# Patient Record
Sex: Female | Born: 1970 | Race: White | Hispanic: No | Marital: Married | Smoking: Never smoker
Health system: Southern US, Community
[De-identification: ages and names within clinical notes are randomized; demographics above are authoritative.]

## PROBLEM LIST (undated history)

## (undated) DIAGNOSIS — Z9289 Personal history of other medical treatment: Secondary | ICD-10-CM

## (undated) DIAGNOSIS — L309 Dermatitis, unspecified: Secondary | ICD-10-CM

## (undated) DIAGNOSIS — I10 Essential (primary) hypertension: Secondary | ICD-10-CM

## (undated) HISTORY — PX: NO PAST SURGERIES: SHX2092

## (undated) HISTORY — DX: Dermatitis, unspecified: L30.9

## (undated) HISTORY — DX: Personal history of other medical treatment: Z92.89

---

## 2004-11-28 ENCOUNTER — Other Ambulatory Visit: Admission: RE | Admit: 2004-11-28 | Discharge: 2004-11-28 | Payer: Self-pay | Admitting: Obstetrics and Gynecology

## 2006-01-29 ENCOUNTER — Other Ambulatory Visit: Admission: RE | Admit: 2006-01-29 | Discharge: 2006-01-29 | Payer: Self-pay | Admitting: Obstetrics and Gynecology

## 2008-09-13 ENCOUNTER — Encounter: Admission: RE | Admit: 2008-09-13 | Discharge: 2008-09-13 | Payer: Self-pay | Admitting: Family Medicine

## 2014-08-31 ENCOUNTER — Other Ambulatory Visit: Payer: Self-pay

## 2014-08-31 DIAGNOSIS — Z1231 Encounter for screening mammogram for malignant neoplasm of breast: Secondary | ICD-10-CM

## 2014-09-14 ENCOUNTER — Encounter (INDEPENDENT_AMBULATORY_CARE_PROVIDER_SITE_OTHER): Payer: Self-pay

## 2014-09-14 ENCOUNTER — Ambulatory Visit
Admission: RE | Admit: 2014-09-14 | Discharge: 2014-09-14 | Disposition: A | Discharge status: Home / Self Care | Payer: BC Managed Care – PPO | Source: Ambulatory Visit

## 2014-09-14 DIAGNOSIS — Z1231 Encounter for screening mammogram for malignant neoplasm of breast: Secondary | ICD-10-CM

## 2014-11-06 ENCOUNTER — Encounter: Payer: BC Managed Care – PPO | Admitting: Physician Assistant

## 2014-12-07 ENCOUNTER — Ambulatory Visit (INDEPENDENT_AMBULATORY_CARE_PROVIDER_SITE_OTHER): Payer: BC Managed Care – PPO | Admitting: Physician Assistant

## 2014-12-07 ENCOUNTER — Ambulatory Visit (HOSPITAL_COMMUNITY): Payer: BC Managed Care – PPO | Attending: Physician Assistant | Admitting: Radiology

## 2014-12-07 DIAGNOSIS — I10 Essential (primary) hypertension: Secondary | ICD-10-CM | POA: Insufficient documentation

## 2014-12-07 DIAGNOSIS — R079 Chest pain, unspecified: Secondary | ICD-10-CM | POA: Diagnosis not present

## 2014-12-07 DIAGNOSIS — R0789 Other chest pain: Secondary | ICD-10-CM

## 2014-12-07 DIAGNOSIS — R9439 Abnormal result of other cardiovascular function study: Secondary | ICD-10-CM

## 2014-12-07 MED ORDER — TECHNETIUM TC 99M SESTAMIBI GENERIC - CARDIOLITE
33.0000 | Freq: Once | INTRAVENOUS | Status: AC | PRN
  Administered 2014-12-07: 33 via INTRAVENOUS

## 2014-12-07 NOTE — Progress Notes (Signed)
Exercise Treadmill Test  Claire Hood is a 43 y.o. female with a  Hx of HTN, HL, FHx CAD referred by PCP for ETT due to atypical chest pain. Non-smoker.  No diabetes.  Exam unremarkable.  ECG:  NSR, HR 86, no ST changes.  Pre-Exercise Testing Evaluation Rhythm: normal sinus  Rate: 86 bpm     Test  Exercise Tolerance Test Ordering MD: Armanda Magicraci Turner, MD  Interpreting MD: Tereso NewcomerScott Renji Berwick, PA-C  Unique Test No: 1  Treadmill:  1  Indication for ETT: chest pain - rule out ischemia/HTN  Contraindication to ETT: No   Stress Modality: exercise - treadmill  Cardiac Imaging Performed: non   Protocol: standard Bruce - maximal  Max BP:  238/67  Max MPHR (bpm):  175 85% MPR (bpm):  149  MPHR obtained (bpm):  155 % MPHR obtained:  88  Reached 85% MPHR (min:sec):  4:00 Total Exercise Time (min-sec):  4:30  Workload in METS:  6.4 Borg Scale: 12  Reason ETT Terminated:  exaggerated hypertensive response    ST Segment Analysis At Rest: normal ST segments - no evidence of significant ST depression With Exercise: significant ischemic ST depression  Other Information Arrhythmia:  No Angina during ETT:  absent (0) Quality of ETT:  diagnostic  ETT Interpretation:  abnormal - evidence of ST depression consistent with ischemia  Comments: Fair exercise capacity. No chest pain. Exaggerated hypertensive BP response to exercise. There was significant inferolateral ST depression at peak exercise.   Recommendations: Arrange Lexiscan Myoview. Reviewed with Dr. Armanda Magicraci Turner (DOD) who agreed. Signed,  Tereso NewcomerScott Burke Terry, PA-C   12/07/2014 10:45 AM

## 2014-12-14 ENCOUNTER — Ambulatory Visit (HOSPITAL_COMMUNITY): Payer: BC Managed Care – PPO | Attending: Cardiovascular Disease

## 2014-12-14 DIAGNOSIS — I10 Essential (primary) hypertension: Secondary | ICD-10-CM | POA: Diagnosis not present

## 2014-12-14 DIAGNOSIS — Z79899 Other long term (current) drug therapy: Secondary | ICD-10-CM | POA: Diagnosis not present

## 2014-12-14 DIAGNOSIS — Z136 Encounter for screening for cardiovascular disorders: Secondary | ICD-10-CM | POA: Insufficient documentation

## 2014-12-14 DIAGNOSIS — R0600 Dyspnea, unspecified: Secondary | ICD-10-CM | POA: Insufficient documentation

## 2014-12-14 DIAGNOSIS — R9439 Abnormal result of other cardiovascular function study: Secondary | ICD-10-CM | POA: Diagnosis present

## 2014-12-14 DIAGNOSIS — R0989 Other specified symptoms and signs involving the circulatory and respiratory systems: Secondary | ICD-10-CM

## 2014-12-14 MED ORDER — REGADENOSON 0.4 MG/5ML IV SOLN
0.4000 mg | Freq: Once | INTRAVENOUS | Status: AC
  Administered 2014-12-14: 0.4 mg via INTRAVENOUS

## 2014-12-14 MED ORDER — TECHNETIUM TC 99M SESTAMIBI GENERIC - CARDIOLITE
33.0000 | Freq: Once | INTRAVENOUS | Status: AC | PRN
  Administered 2014-12-14: 33 via INTRAVENOUS

## 2014-12-14 NOTE — Progress Notes (Signed)
[facility] SITE 3 NUCLEAR MED [address] [place], [place] [address] [phone]    Cardiology Nuclear Med Study  Claire PearsonCarla J Claire BlazingSmith is a 43 y.o. female     MRN : [id number]     DOB: 11/13/1971  Procedure Date: 12/14/2014  Nuclear Med Background Indication for Stress Test:  Evaluation for Ischemia and Abnormal EKG History:  No Known CAD History Cardiac Risk Factors: Hypertension  Symptoms:  Chest Pain and DOE   Nuclear Pre-Procedure Caffeine/Decaff Intake:  None NPO After: 8:00pm   Lungs:  clear O2 Sat: 98% on room air. IV 0.9% NS with Angio Cath:  22g  IV Site: L Hand  IV Started by:  Frederick Peerseresa Johnson, EMT-P  Chest Size (in):  42 Cup Size: C  Height: 66inches Weight: 258 lb  BMI:  Body mass index is 35.44 kg/(m^2). Tech Comments:  No meds with held this am    Nuclear Med Study 1 or 2 day study: 2 day  Stress Test Type:  Eugenie BirksLexiscan  Reading MD: n/a  Order Authorizing Provider:  Armanda Magicraci Turner, MD  Resting Radionuclide: Technetium 4748m Sestamibi  Resting Radionuclide Dose: 33.0 mCi on 12/07/2014  Stress Radionuclide:  Technetium 4648m Sestamibi  Stress Radionuclide Dose: 33.0 mCi on 12/14/2014          Stress Protocol Rest HR: 71 Stress HR: 111  Rest BP: 128/66 Stress BP: 127/58  Exercise Time (min): n/a METS: n/a   Predicted Max HR: 175 bpm % Max HR: 40.57 bpm Rate Pressure Product: 9017   Dose of Adenosine (mg):  n/a Dose of Lexiscan: 0.4 mg  Dose of Atropine (mg): n/a Dose of Dobutamine: n/a mcg/kg/min (at max HR)  Stress Test Technologist: Frederick Peerseresa Johnson, EMT-P  Nuclear Technologist:  Kerby NoraElzbieta Kubak, CNMT     Rest Procedure:  Myocardial perfusion imaging was performed at rest 45 minutes following the intravenous administration of Technetium 3748m Sestamibi. Rest ECG: NSR - Normal EKG  Stress Procedure:  The patient received IV Lexiscan 0.4 mg over 15-seconds.  Technetium 1748m Sestamibi injected at 30-seconds.  Quantitative spect images were obtained after a 45  minute delay. Stress ECG: No significant change from baseline ECG  QPS Raw Data Images:  There is a breast shadow that accounts for the anterior attenuation. Stress Images:  Normal homogeneous uptake in all areas of the myocardium. Rest Images:  Normal homogeneous uptake in all areas of the myocardium. Subtraction (SDS):  There is a fixed anterior defect that is most consistent with breast attenuation. Transient Ischemic Dilatation (Normal <1.22):  1.30 Lung/Heart Ratio (Normal <0.45):  0.23  Quantitative Gated Spect Images QGS EDV:  103 ml QGS ESV:  39 ml  Impression Exercise Capacity:  Lexiscan with no exercise. BP Response:  Normal blood pressure response. Clinical Symptoms:  No chest pain. ECG Impression:  No significant ECG changes with Lexiscan. Comparison with Prior Nuclear Study: No previous nuclear study performed  Overall Impression:  Low risk stress nuclear study. There is anterior wall attenuation on rest and stress images (more pronounced on stress images) which is most suggestive of breast attenuation. SDS 4.  The left ventricular function is normal and the anterior wall contracts normally. No ischemia. Apparent increased TID appears to be related to body habitus and slightly tangential cuts through the LV cavity more pronounced on the stress images.  LV Ejection Fraction: 62%.  LV Wall Motion:  NL LV Function; NL Wall Motion  Cassell Clementhomas Hatsuko Bizzarro MD

## 2014-12-17 ENCOUNTER — Encounter: Payer: Self-pay | Admitting: Physician Assistant

## 2015-12-27 IMAGING — MG MM SCREENING BREAST TOMO BILATERAL
8 series · 8 of 24 positions shown · non-contrast
Comparison: None.

CLINICAL DATA: Screening.

EXAM:
DIGITAL SCREENING BILATERAL MAMMOGRAM WITH 3D TOMO WITH CAD

[R MLO]
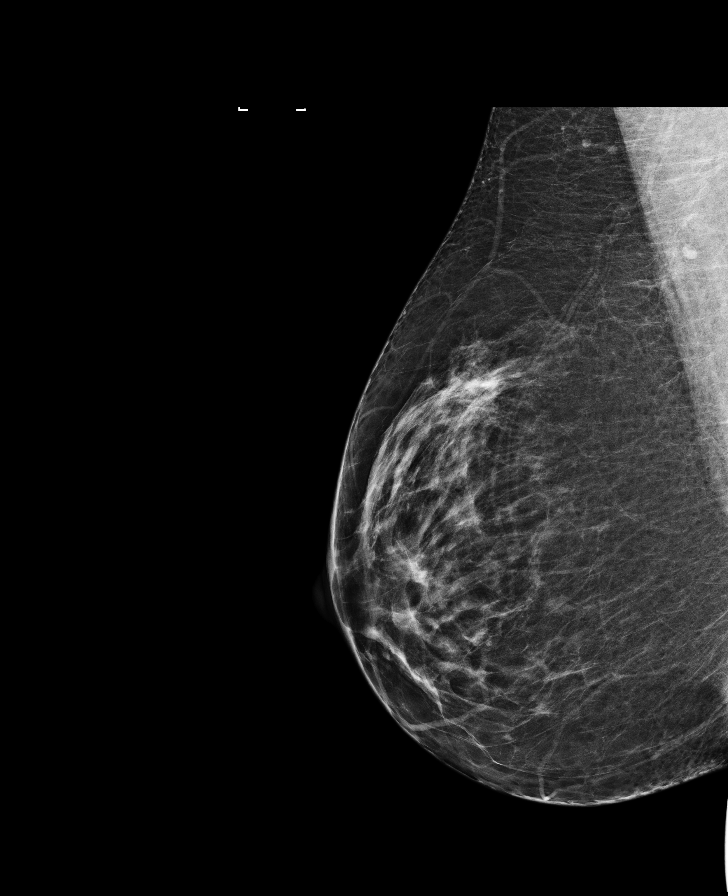

[R CC]
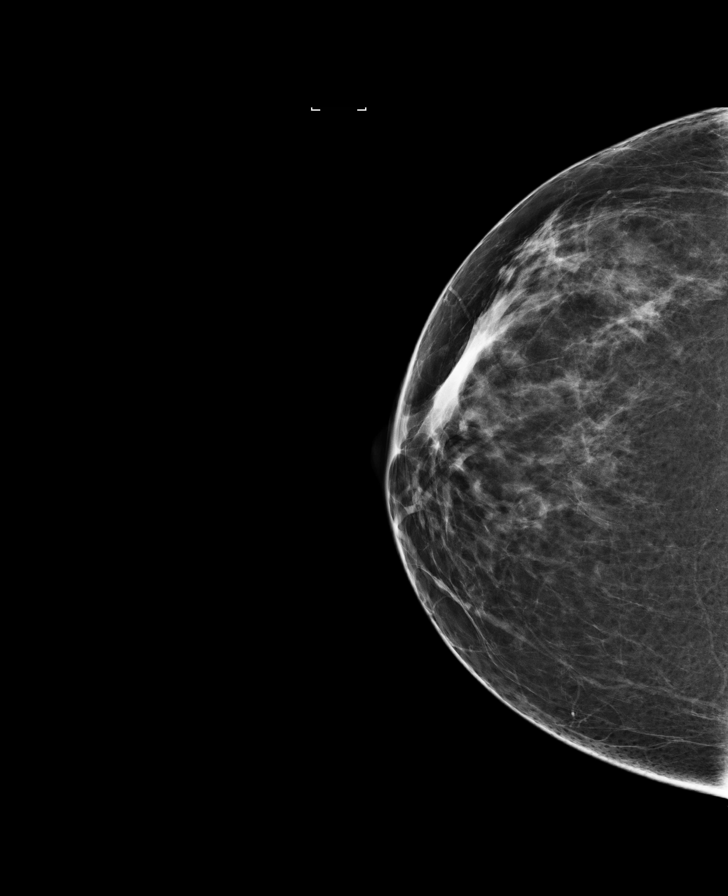

[L MLO]
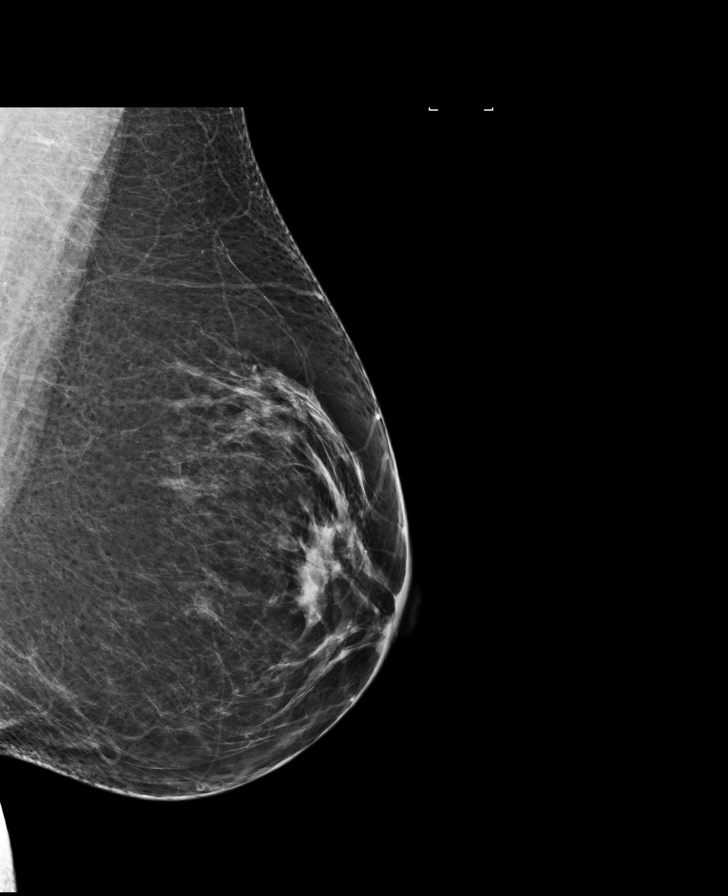

[L CC]
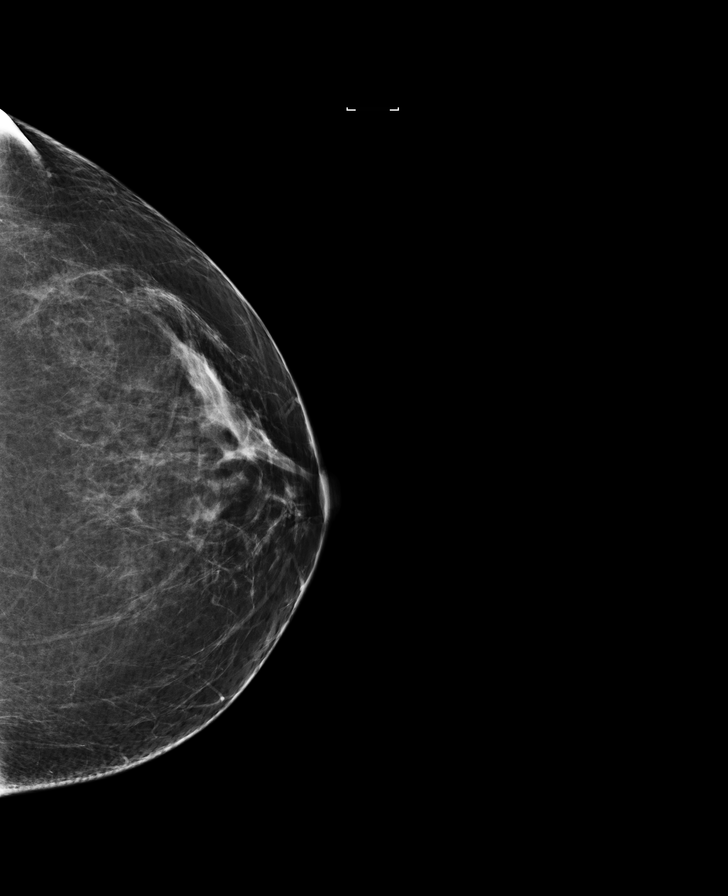

[L MLO tomo · tomo slice 43/85.0]
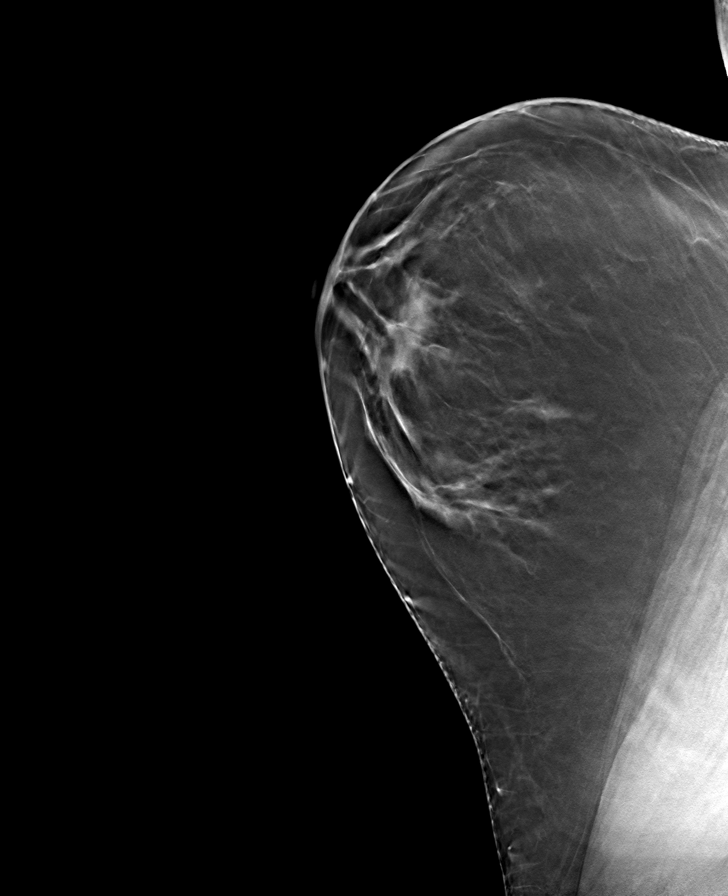

[R CC tomo · tomo slice 37/74.0]
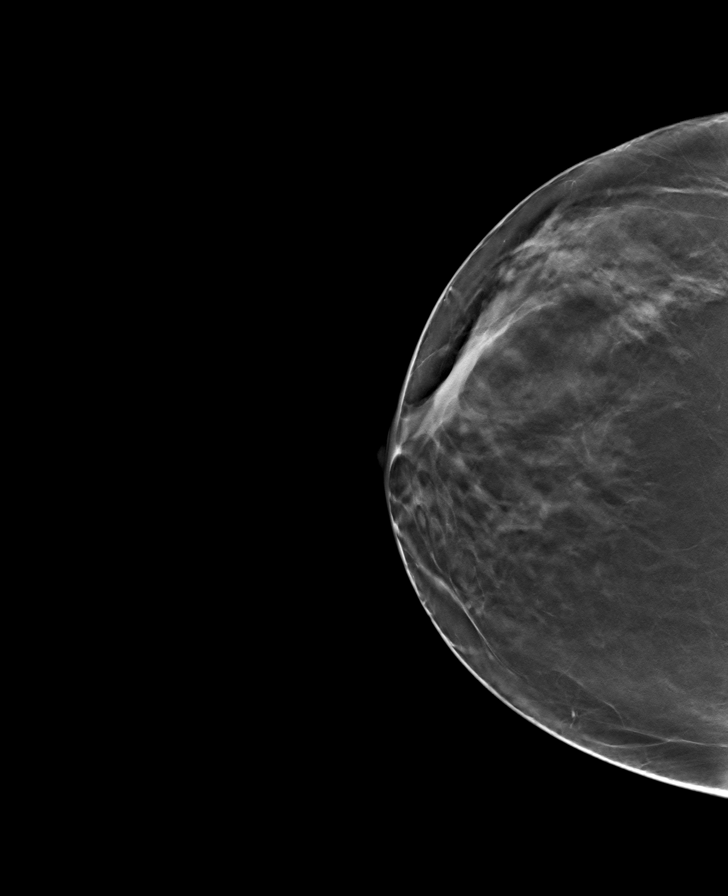

[L CC tomo · tomo slice 44/87.0]
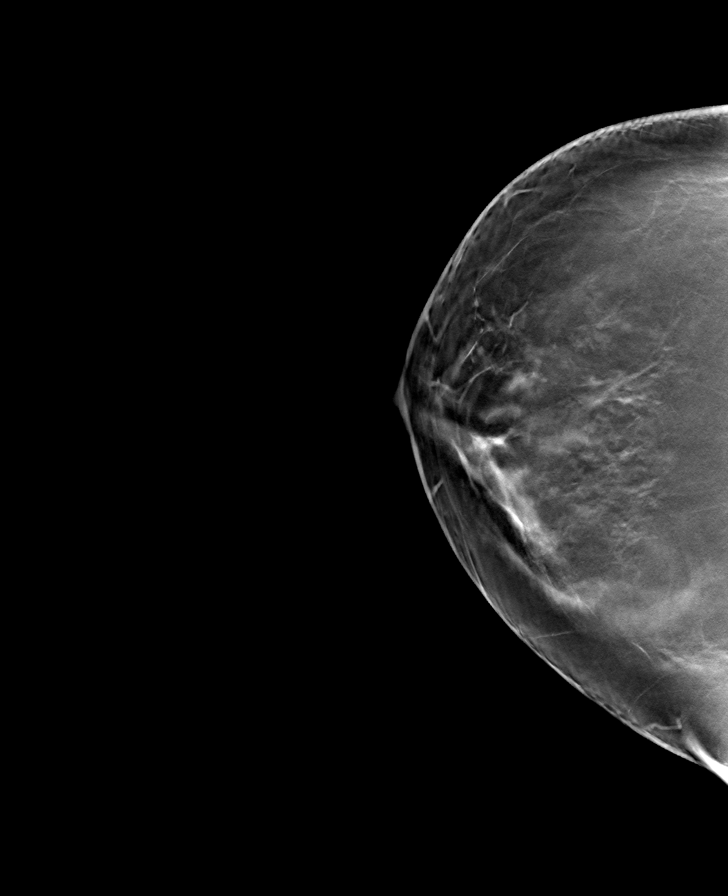

[R MLO tomo · tomo slice 42/83.0]
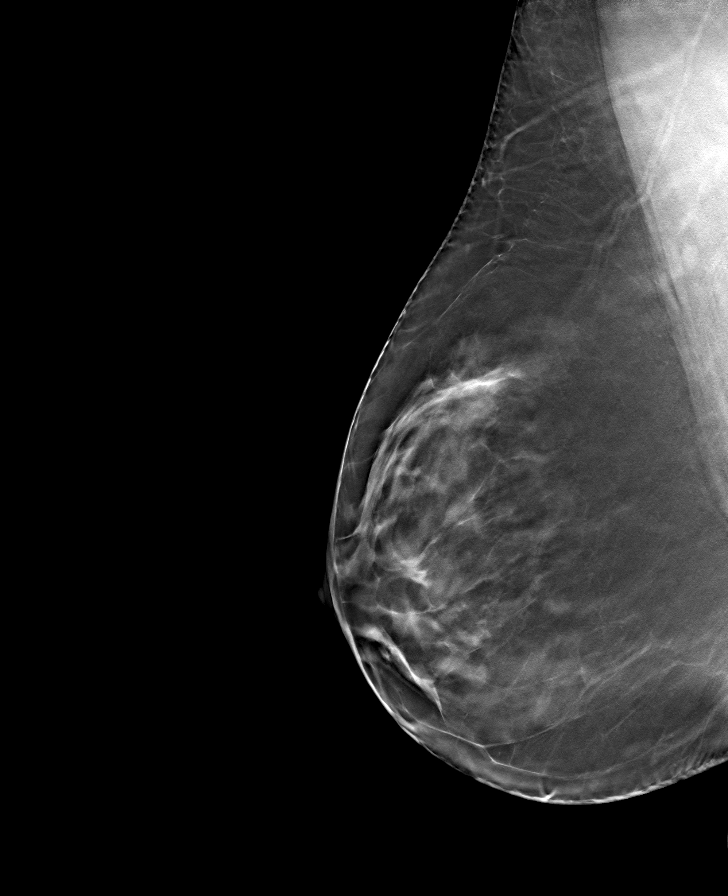

[8 of 24 positions shown; findings below may reference images not displayed]

ACR Breast Density Category b: There are scattered areas of
fibroglandular density.
FINDINGS: There are no findings suspicious for malignancy. Images were
processed with CAD.
IMPRESSION: No mammographic evidence of malignancy. A result letter of this
screening mammogram will be mailed directly to the patient.

RECOMMENDATION:
Screening mammogram in one year. (Code:2K-[id number])

BI-RADS CATEGORY  1: Negative.

## 2018-01-25 ENCOUNTER — Other Ambulatory Visit: Payer: Self-pay | Admitting: Emergency Medicine

## 2018-12-29 ENCOUNTER — Ambulatory Visit
Admission: EM | Admit: 2018-12-29 | Discharge: 2018-12-29 | Disposition: A | Discharge status: Home / Self Care | Payer: 59

## 2018-12-29 ENCOUNTER — Encounter: Payer: Self-pay | Admitting: Emergency Medicine

## 2018-12-29 DIAGNOSIS — H6691 Otitis media, unspecified, right ear: Secondary | ICD-10-CM | POA: Insufficient documentation

## 2018-12-29 HISTORY — DX: Essential (primary) hypertension: I10

## 2018-12-29 MED ORDER — AMOXICILLIN 500 MG PO CAPS: 500.0000 mg | ORAL_CAPSULE | Freq: Three times a day (TID) | ORAL | 0 refills | Status: DC

## 2018-12-29 NOTE — Discharge Instructions (Signed)
Return if any problems.  See your Physician for recheck  °

## 2018-12-29 NOTE — ED Triage Notes (Signed)
Pt presents to [facility] for assessment of sore throat, cough, congestion, and worsening right ear pain since this evening at dinner.

## 2018-12-29 NOTE — ED Notes (Signed)
Patient able to ambulate independently  

## 2018-12-31 NOTE — ED Provider Notes (Signed)
[facility]    CSN: [id number] Arrival date & time: 12/29/18  1959     History   Chief Complaint Chief Complaint  Patient presents with  . URI    HPI Claire Hood is a 48 y.o. female.   The history is provided by the patient. No language interpreter was used.  URI  Presenting symptoms: congestion, cough and ear pain   Severity:  Moderate Onset quality:  Gradual Timing:  Constant Progression:  Worsening Chronicity:  New Relieved by:  Nothing Worsened by:  Nothing Ineffective treatments:  None tried Associated symptoms: no sinus pain   Risk factors: no sick contacts     Past Medical History:  Diagnosis Date  . Hx of cardiovascular stress test    Lexiscan Myoview (12/15):  Breast attenuation, no ischemia, EF 62%, Low Risk  . Hypertension     There are no active problems to display for this patient.   History reviewed. No pertinent surgical history.  OB History   No obstetric history on file.      Home Medications    Prior to Admission medications   Medication Sig Start Date End Date Taking? Authorizing Provider  atorvastatin (LIPITOR) 20 MG tablet Take 20 mg by mouth daily. Unsure of dose   Yes [provider]  lisinopril (PRINIVIL,ZESTRIL) 20 MG tablet Take 20 mg by mouth daily. Unsure of dose   Yes [provider]  montelukast (SINGULAIR) 4 MG chewable tablet Chew 4 mg by mouth at bedtime.   Yes [provider]  amoxicillin (AMOXIL) 500 MG capsule Take 1 capsule (500 mg total) by mouth 3 (three) times daily. 12/29/18   Elson Areas, PA-C    Family History History reviewed. No pertinent family history.  Social History Social History   Tobacco Use  . Smoking status: Never Smoker  . Smokeless tobacco: Never Used  Substance Use Topics  . Alcohol use: Not Currently  . Drug use: Never     Allergies   Patient has no known allergies.   Review of Systems Review of Systems  HENT: Positive for congestion  and ear pain. Negative for sinus pain.   Respiratory: Positive for cough.   All other systems reviewed and are negative.    Physical Exam Triage Vital Signs ED Triage Vitals  Enc Vitals Group     BP 12/29/18 2011 (!) 161/88     Pulse Rate 12/29/18 2011 (!) 113     Resp 12/29/18 2011 20     Temp 12/29/18 2011 97.9 F (36.6 C)     Temp Source 12/29/18 2011 Oral     SpO2 12/29/18 2011 97 %     Weight --      Height --      Head Circumference --      Peak Flow --      Pain Score 12/29/18 2012 10     Pain Loc --      Pain Edu? --      Excl. in GC? --    No data found.  Updated Vital Signs BP (!) 161/88 (BP Location: Right Arm)   Pulse (!) 113   Temp 97.9 F (36.6 C) (Oral)   Resp 20   LMP 12/15/2018   SpO2 97%   Visual Acuity Right Eye Distance:   Left Eye Distance:   Bilateral Distance:    Right Eye Near:   Left Eye Near:    Bilateral Near:     Physical Exam  Constitutional:      Appearance: She is well-developed.  HENT:     Head: Normocephalic and atraumatic.     Comments: Right tm erythematous, bulging    Nose: Nose normal.     Mouth/Throat:     Mouth: Mucous membranes are moist.     Pharynx: Posterior oropharyngeal erythema present.  Eyes:     Conjunctiva/sclera: Conjunctivae normal.     Pupils: Pupils are equal, round, and reactive to light.  Neck:     Musculoskeletal: Normal range of motion and neck supple.  Cardiovascular:     Rate and Rhythm: Normal rate.  Pulmonary:     Effort: Pulmonary effort is normal.  Abdominal:     Palpations: Abdomen is soft.  Musculoskeletal: Normal range of motion.  Skin:    General: Skin is warm and dry.  Neurological:     Mental Status: She is alert and oriented to person, place, and time.      UC Treatments / Results  Labs (all labs ordered are listed, but only abnormal results are displayed) Labs Reviewed - No data to display  EKG None  Radiology No results found.  Procedures Procedures (including  critical care time)  Medications Ordered in UC Medications - No data to display  Initial Impression / Assessment and Plan / UC Course  I have reviewed the triage vital signs and the nursing notes.  Pertinent labs & imaging results that were available during my care of the patient were reviewed by me and considered in my medical decision making (see chart for details).      Final Clinical Impressions(s) / UC Diagnoses   Final diagnoses:  Right otitis media, unspecified otitis media type     Discharge Instructions     Return if any problems.  See your Physician for recheck.    ED Prescriptions    Medication Sig Dispense Auth. Provider   amoxicillin (AMOXIL) 500 MG capsule Take 1 capsule (500 mg total) by mouth 3 (three) times daily. 30 capsule Elson Areas, [place]     Controlled Substance Prescriptions [place] Controlled Substance Registry consulted? Not Applicable   Elson Areas, [place] 12/31/18 2951

## 2019-03-28 ENCOUNTER — Ambulatory Visit (INDEPENDENT_AMBULATORY_CARE_PROVIDER_SITE_OTHER): Payer: 59 | Admitting: Allergy and Immunology

## 2019-03-28 ENCOUNTER — Other Ambulatory Visit: Payer: Self-pay

## 2019-03-28 ENCOUNTER — Encounter: Payer: Self-pay | Admitting: Allergy and Immunology

## 2019-03-28 VITALS — BP 150/80 | HR 86 | Temp 97.9°F | Resp 18 | Ht 66.0 in | Wt 269.0 lb

## 2019-03-28 DIAGNOSIS — J3089 Other allergic rhinitis: Secondary | ICD-10-CM

## 2019-03-28 DIAGNOSIS — G43909 Migraine, unspecified, not intractable, without status migrainosus: Secondary | ICD-10-CM | POA: Diagnosis not present

## 2019-03-28 DIAGNOSIS — I10 Essential (primary) hypertension: Secondary | ICD-10-CM | POA: Diagnosis not present

## 2019-03-28 DIAGNOSIS — J301 Allergic rhinitis due to pollen: Secondary | ICD-10-CM

## 2019-03-28 MED ORDER — AZELASTINE HCL 0.15 % NA SOLN: 1.0000 | Freq: Two times a day (BID) | NASAL | 5 refills | Status: AC | PRN

## 2019-03-28 NOTE — Patient Instructions (Addendum)
  1.  Allergen and perfume avoidance measures  2.  Continue montelukast 10 mg tablet daily  3.  OTC Nasacort/Rhinocort 1 spray each nostril 3-7 times a week  4.  Azelastine 1-2 sprays each nostril 1-2 times per day if needed  5.  Ibuprofen 600-800 mg at onset of headache  6.  Further evaluation and treatment?  7.  Return to clinic in 1 year or earlier if problem  8.  Check blood pressure and address issue if still elevated

## 2019-03-28 NOTE — Progress Notes (Signed)
[place] - [place] - [place]   Dear Dr. Clovis Riley,  Thank you for referring Claire Hood to the [facility] [place] on 03/28/2019.   Below is a summation of this patient's evaluation and recommendations.  Thank you for your referral. I will keep you informed about this patient's response to treatment.   If you have any questions please do not hesitate to contact me.   Sincerely,  Jessica Priest, MD Allergy / Immunology [facility]   ______________________________________________________________________    NEW PATIENT NOTE  Referring Provider: Clovis Riley, L.August Saucer, MD Primary Provider: Clovis Riley, Elbert Ewings.August Saucer, MD Date of office visit: 03/28/2019    Subjective:   Chief Complaint:  Claire Hood (DOB: [birth year]) is a 48 y.o. female who presents to the clinic on 03/28/2019 with a chief complaint of Allergies .     HPI: Lizmarie presents to this clinic in reevaluation of allergic rhinoconjunctivitis.  Apparently had seen her in this clinic in 2013.  Overall she had done very well with her atopic disease while consistently using montelukast and occasionally and rarely a nasal steroid and antihistamine.  The big problem that she still has is the fact that if she gets exposed to outdoor pollen she will develop stuffiness and some itchy eyes.  As well, when she gets exposed to perfumes she will she develop stuffy nose and itchy eyes and she will also develop a headache within about 5 minutes that is located around and behind her eyes and sometimes her temporal region without any vision changes or dizziness but occasionally with nausea.  Fortunately, these headaches are very infrequent and appear to occur just a handful of times per year.  Past Medical History:  Diagnosis Date  . Eczema   . Hx of cardiovascular stress test    Lexiscan Myoview (12/15):  Breast attenuation, no ischemia, EF  62%, Low Risk  . Hypertension     Past Surgical History:  Procedure Laterality Date  . NO PAST SURGERIES      Allergies as of 03/28/2019   No Known Allergies     Medication List      atorvastatin 40 MG tablet Commonly known as:  LIPITOR TK 1 T PO QD   losartan 50 MG tablet Commonly known as:  COZAAR TK 1 T PO QD   montelukast 10 MG tablet Commonly known as:  SINGULAIR Take 10 mg by mouth at bedtime.       Review of systems negative except as noted in HPI / PMHx or noted below:  Review of Systems  Constitutional: Negative.   HENT: Negative.   Eyes: Negative.   Respiratory: Negative.   Cardiovascular: Negative.   Gastrointestinal: Negative.   Genitourinary: Negative.   Musculoskeletal: Negative.   Skin: Negative.   Neurological: Negative.   Endo/Heme/Allergies: Negative.   Psychiatric/Behavioral: Negative.     Family History  Problem Relation Age of Onset  . Eczema Mother   . Atopy Neg Hx   . Asthma Neg Hx   . Allergic rhinitis Neg Hx   . Angioedema Neg Hx   . Urticaria Neg Hx   . Immunodeficiency Neg Hx     Social History   Socioeconomic History  . Marital status: Married    Spouse name: Not on file  . Number of children: Not on file  . Years of education: Not on file  . Highest education level: Not on file  Occupational History  . Not on file  Social Needs  . Financial resource strain: Not on file  . Food insecurity:    Worry: Not on file    Inability: Not on file  . Transportation needs:    Medical: Not on file    Non-medical: Not on file  Tobacco Use  . Smoking status: Never Smoker  . Smokeless tobacco: Never Used  Substance and Sexual Activity  . Alcohol use: Not Currently  . Drug use: Never  . Sexual activity: Not on file  Lifestyle  . Physical activity:    Days per week: Not on file    Minutes per session: Not on file  . Stress: Not on file  Relationships  . Social connections:    Talks on phone: Not on file    Gets  together: Not on file    Attends religious service: Not on file    Active member of club or organization: Not on file    Attends meetings of clubs or organizations: Not on file    Relationship status: Not on file  . Intimate partner violence:    Fear of current or ex partner: Not on file    Emotionally abused: Not on file    Physically abused: Not on file    Forced sexual activity: Not on file  Other Topics Concern  . Not on file  Social History Narrative  . Not on file    Environmental and Social history  Lives in a house with a dry environment, no animals located inside the household, no carpet in the bedroom, plastic on the bed, plastic on the pillow, no smoking ongoing inside the household.  She is a Armed forces operational officer.  Objective:   Vitals:   03/28/19 0901  BP: (!) 150/80  Pulse: 86  Resp: 18  Temp: 97.9 F (36.6 C)  SpO2: 97%   Height:  (167.6 cm) Weight: 269 lb (122 kg)  Physical Exam Constitutional:      Appearance: She is not diaphoretic.  HENT:     Head: Normocephalic.     Right Ear: Tympanic membrane, ear canal and external ear normal.     Left Ear: Tympanic membrane, ear canal and external ear normal.     Nose: Nose normal. No mucosal edema or rhinorrhea.     Mouth/Throat:     Pharynx: Uvula midline. No oropharyngeal exudate.  Eyes:     Conjunctiva/sclera: Conjunctivae normal.  Neck:     Thyroid: No thyromegaly.     Trachea: Trachea normal. No tracheal tenderness or tracheal deviation.  Cardiovascular:     Rate and Rhythm: Normal rate and regular rhythm.     Heart sounds: Normal heart sounds, S1 normal and S2 normal. No murmur.  Pulmonary:     Effort: No respiratory distress.     Breath sounds: Normal breath sounds. No stridor. No wheezing or rales.  Lymphadenopathy:     Head:     Right side of head: No tonsillar adenopathy.     Left side of head: No tonsillar adenopathy.     Cervical: No cervical adenopathy.  Skin:    Findings: No erythema  or rash.     Nails: There is no clubbing.   Neurological:     Mental Status: She is alert.     Diagnostics: Allergy skin tests were not performed.   Assessment and Plan:    1. Perennial allergic rhinitis   2. Seasonal allergic rhinitis due to pollen  3. Migraine syndrome   4. Hypertension, unspecified type     1.  Allergen and perfume avoidance measures  2.  Continue montelukast 10 mg tablet daily  3.  OTC Nasacort/Rhinocort 1 spray each nostril 3-7 times a week  4.  Azelastine 1-2 sprays each nostril 1-2 times per day if needed  5.  Ibuprofen 600-800 mg at onset of headache  6.  Further evaluation and treatment?  7.  Return to clinic in 1 year or earlier if problem  8.  Check blood pressure and address issue if still elevated  I suspect that Deaun will do well with the therapy noted above directed against her atopic disease and she will keep in contact with me noting her response to this approach.  She has some intermittent headaches and she can try ibuprofen at onset of these headaches and once again contact me noting her response to this approach.  She does have a slightly elevated blood pressure issue and she needs to establish if this is a trend and if so that she will need to have this addressed by her primary care doctor.  Jessica Priest, MD Allergy / Immunology [facility] [place]

## 2019-03-29 ENCOUNTER — Encounter: Payer: Self-pay | Admitting: Allergy and Immunology

## 2021-04-03 ENCOUNTER — Other Ambulatory Visit: Payer: Self-pay

## 2021-04-03 DIAGNOSIS — Z1231 Encounter for screening mammogram for malignant neoplasm of breast: Secondary | ICD-10-CM

## 2021-05-27 ENCOUNTER — Ambulatory Visit: Payer: 59

## 2022-07-30 DIAGNOSIS — L089 Local infection of the skin and subcutaneous tissue, unspecified: Secondary | ICD-10-CM | POA: Diagnosis not present

## 2022-07-30 DIAGNOSIS — M7662 Achilles tendinitis, left leg: Secondary | ICD-10-CM | POA: Diagnosis not present

## 2022-07-30 DIAGNOSIS — L723 Sebaceous cyst: Secondary | ICD-10-CM | POA: Diagnosis not present

## 2022-07-30 DIAGNOSIS — S838X1A Sprain of other specified parts of right knee, initial encounter: Secondary | ICD-10-CM | POA: Diagnosis not present

## 2022-08-27 ENCOUNTER — Encounter: Payer: Self-pay | Admitting: Podiatry

## 2022-08-27 ENCOUNTER — Ambulatory Visit (INDEPENDENT_AMBULATORY_CARE_PROVIDER_SITE_OTHER): Payer: BC Managed Care – PPO

## 2022-08-27 ENCOUNTER — Ambulatory Visit: Payer: BC Managed Care – PPO | Admitting: Podiatry

## 2022-08-27 ENCOUNTER — Telehealth: Payer: Self-pay | Admitting: *Deleted

## 2022-08-27 DIAGNOSIS — M7662 Achilles tendinitis, left leg: Secondary | ICD-10-CM | POA: Diagnosis not present

## 2022-08-27 DIAGNOSIS — M7732 Calcaneal spur, left foot: Secondary | ICD-10-CM | POA: Diagnosis not present

## 2022-08-27 DIAGNOSIS — M76892 Other specified enthesopathies of left lower limb, excluding foot: Secondary | ICD-10-CM | POA: Diagnosis not present

## 2022-08-27 DIAGNOSIS — M6740 Ganglion, unspecified site: Secondary | ICD-10-CM

## 2022-08-27 DIAGNOSIS — R2242 Localized swelling, mass and lump, left lower limb: Secondary | ICD-10-CM | POA: Diagnosis not present

## 2022-08-27 MED ORDER — METHYLPREDNISOLONE 4 MG PO TBPK: ORAL_TABLET | ORAL | 0 refills | Status: AC

## 2022-08-27 MED ORDER — METHYLPREDNISOLONE 4 MG PO TBPK: ORAL_TABLET | ORAL | 0 refills | Status: DC

## 2022-08-27 NOTE — Patient Instructions (Signed)

## 2022-08-27 NOTE — Telephone Encounter (Signed)
Patient is calling and said that she wanted her prescription sent to [facility].   Resent to correct location patient [facility] location has been notified

## 2022-08-27 NOTE — Progress Notes (Signed)
  Subjective:  Patient ID: Claire Hood, female    DOB: 1971/03/06,   MRN: [id number]  Chief Complaint  Patient presents with   Cyst    Cyst near the back of left ankle ongoing for 3 month .. Pain varies - throbbing pain    51 y.o. female presents for left heel pain that has been going on for 3 months . Relates she was told she had a cyst at the back of the ankle. States the pain will vary but when it gets worse it can be very painful to the touch. Denies any treatments but has noticed it has increased in size. . She is diabetic and her last A1c was 6.7.  Denies any other pedal complaints. Denies n/v/f/c.   Past Medical History:  Diagnosis Date   Eczema    Hx of cardiovascular stress test    Lexiscan Myoview (12/15):  Breast attenuation, no ischemia, EF 62%, Low Risk   Hypertension     Objective:  Physical Exam: Vascular: DP/PT pulses 2/4 bilateral. CFT <3 seconds. Normal hair growth on digits. No edema.  Skin. No lacerations or abrasions bilateral feet.  Musculoskeletal: MMT 5/5 bilateral lower extremities in DF, PF, Inversion and Eversion. Deceased ROM in DF of ankle joint. Tender to posterior left achilles where there is a palpable spur. Minimal tenderness. No pain with DF and PF.  Neurological: Sensation intact to light touch.   Assessment:   1. Tendonitis, Achilles, left      Plan:  Patient was evaluated and treated and all questions answered. -Xrays reviewed. No acute fractures or discoloations. There is a prominent spur noted to the posterior calcaneous at achilles insertion.  -Discussed Achilles insertional tendonitis and treatment options with patient.  -Discussed stretching exercises. -Rx Medrol dose pack provided.  -Heel lifts provided and discussed proper shoewear.  -Discussed if no improvement will consider MRI/PT/EPAT/PRP injections.  -Patient to return to office as needed or sooner if condition worsens.   Louann Sjogren, DPM

## 2022-10-08 ENCOUNTER — Ambulatory Visit: Payer: BC Managed Care – PPO | Admitting: Podiatrist

## 2022-11-13 DIAGNOSIS — I10 Essential (primary) hypertension: Secondary | ICD-10-CM | POA: Diagnosis not present

## 2022-11-13 DIAGNOSIS — E119 Type 2 diabetes mellitus without complications: Secondary | ICD-10-CM | POA: Diagnosis not present

## 2022-11-13 DIAGNOSIS — E785 Hyperlipidemia, unspecified: Secondary | ICD-10-CM | POA: Diagnosis not present

## 2022-12-09 DIAGNOSIS — J069 Acute upper respiratory infection, unspecified: Secondary | ICD-10-CM | POA: Diagnosis not present

## 2023-03-05 DIAGNOSIS — L723 Sebaceous cyst: Secondary | ICD-10-CM | POA: Diagnosis not present

## 2023-03-05 DIAGNOSIS — L089 Local infection of the skin and subcutaneous tissue, unspecified: Secondary | ICD-10-CM | POA: Diagnosis not present

## 2023-04-12 DIAGNOSIS — D1801 Hemangioma of skin and subcutaneous tissue: Secondary | ICD-10-CM | POA: Diagnosis not present

## 2023-04-12 DIAGNOSIS — L578 Other skin changes due to chronic exposure to nonionizing radiation: Secondary | ICD-10-CM | POA: Diagnosis not present

## 2023-04-12 DIAGNOSIS — L814 Other melanin hyperpigmentation: Secondary | ICD-10-CM | POA: Diagnosis not present

## 2023-04-12 DIAGNOSIS — L821 Other seborrheic keratosis: Secondary | ICD-10-CM | POA: Diagnosis not present

## 2023-05-14 DIAGNOSIS — E1165 Type 2 diabetes mellitus with hyperglycemia: Secondary | ICD-10-CM | POA: Diagnosis not present

## 2023-05-14 DIAGNOSIS — I10 Essential (primary) hypertension: Secondary | ICD-10-CM | POA: Diagnosis not present

## 2023-05-14 DIAGNOSIS — E785 Hyperlipidemia, unspecified: Secondary | ICD-10-CM | POA: Diagnosis not present

## 2023-11-05 DIAGNOSIS — E785 Hyperlipidemia, unspecified: Secondary | ICD-10-CM | POA: Diagnosis not present

## 2023-11-05 DIAGNOSIS — I10 Essential (primary) hypertension: Secondary | ICD-10-CM | POA: Diagnosis not present

## 2023-11-05 DIAGNOSIS — E1165 Type 2 diabetes mellitus with hyperglycemia: Secondary | ICD-10-CM | POA: Diagnosis not present

## 2024-01-27 DIAGNOSIS — D125 Benign neoplasm of sigmoid colon: Secondary | ICD-10-CM | POA: Diagnosis not present

## 2024-01-27 DIAGNOSIS — Z1211 Encounter for screening for malignant neoplasm of colon: Secondary | ICD-10-CM | POA: Diagnosis not present

## 2024-01-27 DIAGNOSIS — K5730 Diverticulosis of large intestine without perforation or abscess without bleeding: Secondary | ICD-10-CM | POA: Diagnosis not present

## 2024-04-10 DIAGNOSIS — J0140 Acute pansinusitis, unspecified: Secondary | ICD-10-CM | POA: Diagnosis not present

## 2024-04-24 DIAGNOSIS — D229 Melanocytic nevi, unspecified: Secondary | ICD-10-CM | POA: Diagnosis not present

## 2024-04-24 DIAGNOSIS — L821 Other seborrheic keratosis: Secondary | ICD-10-CM | POA: Diagnosis not present

## 2024-04-24 DIAGNOSIS — L578 Other skin changes due to chronic exposure to nonionizing radiation: Secondary | ICD-10-CM | POA: Diagnosis not present

## 2024-04-24 DIAGNOSIS — L814 Other melanin hyperpigmentation: Secondary | ICD-10-CM | POA: Diagnosis not present

## 2024-05-23 DIAGNOSIS — L03032 Cellulitis of left toe: Secondary | ICD-10-CM | POA: Diagnosis not present

## 2024-05-30 DIAGNOSIS — E785 Hyperlipidemia, unspecified: Secondary | ICD-10-CM | POA: Diagnosis not present

## 2024-05-30 DIAGNOSIS — I10 Essential (primary) hypertension: Secondary | ICD-10-CM | POA: Diagnosis not present

## 2024-05-30 DIAGNOSIS — E1165 Type 2 diabetes mellitus with hyperglycemia: Secondary | ICD-10-CM | POA: Diagnosis not present

## 2024-06-26 DIAGNOSIS — Z1231 Encounter for screening mammogram for malignant neoplasm of breast: Secondary | ICD-10-CM | POA: Diagnosis not present

## 2024-06-26 DIAGNOSIS — N951 Menopausal and female climacteric states: Secondary | ICD-10-CM | POA: Diagnosis not present

## 2024-06-26 DIAGNOSIS — Z01419 Encounter for gynecological examination (general) (routine) without abnormal findings: Secondary | ICD-10-CM | POA: Diagnosis not present

## 2024-06-26 DIAGNOSIS — Z124 Encounter for screening for malignant neoplasm of cervix: Secondary | ICD-10-CM | POA: Diagnosis not present

## 2024-09-01 DIAGNOSIS — L732 Hidradenitis suppurativa: Secondary | ICD-10-CM | POA: Diagnosis not present
